# Patient Record
Sex: Female | Born: 1970 | Race: White | Hispanic: No | Marital: Married | Smoking: Former smoker
Health system: Southern US, Community
[De-identification: ages and names within clinical notes are randomized; demographics above are authoritative.]

## PROBLEM LIST (undated history)

## (undated) DIAGNOSIS — Z8742 Personal history of other diseases of the female genital tract: Secondary | ICD-10-CM

## (undated) DIAGNOSIS — D649 Anemia, unspecified: Secondary | ICD-10-CM

## (undated) DIAGNOSIS — B009 Herpesviral infection, unspecified: Secondary | ICD-10-CM

## (undated) DIAGNOSIS — F3281 Premenstrual dysphoric disorder: Secondary | ICD-10-CM

## (undated) DIAGNOSIS — B977 Papillomavirus as the cause of diseases classified elsewhere: Secondary | ICD-10-CM

## (undated) DIAGNOSIS — J45909 Unspecified asthma, uncomplicated: Secondary | ICD-10-CM

## (undated) HISTORY — DX: Personal history of other diseases of the female genital tract: Z87.42

## (undated) HISTORY — DX: Herpesviral infection, unspecified: B00.9

## (undated) HISTORY — PX: CYST REMOVAL HAND: SHX6279

## (undated) HISTORY — DX: Premenstrual dysphoric disorder: F32.81

## (undated) HISTORY — DX: Anemia, unspecified: D64.9

## (undated) HISTORY — PX: DILATATION & CURRETTAGE/HYSTEROSCOPY WITH RESECTOCOPE: SHX5572

## (undated) HISTORY — PX: WISDOM TOOTH EXTRACTION: SHX21

## (undated) HISTORY — DX: Papillomavirus as the cause of diseases classified elsewhere: B97.7

## (undated) HISTORY — DX: Unspecified asthma, uncomplicated: J45.909

---

## 1998-03-21 ENCOUNTER — Inpatient Hospital Stay (HOSPITAL_COMMUNITY): Admission: AD | Admit: 1998-03-21 | Discharge: 1998-03-21 | Payer: Self-pay | Admitting: *Deleted

## 1998-09-26 ENCOUNTER — Emergency Department (HOSPITAL_COMMUNITY): Admission: EM | Admit: 1998-09-26 | Discharge: 1998-09-26 | Payer: Self-pay | Admitting: Emergency Medicine

## 1999-09-30 ENCOUNTER — Inpatient Hospital Stay (HOSPITAL_COMMUNITY): Admission: AD | Admit: 1999-09-30 | Discharge: 1999-09-30 | Payer: Self-pay | Admitting: Obstetrics and Gynecology

## 1999-10-06 ENCOUNTER — Ambulatory Visit (HOSPITAL_COMMUNITY): Admission: RE | Admit: 1999-10-06 | Discharge: 1999-10-06 | Payer: Self-pay | Admitting: Obstetrics & Gynecology

## 1999-10-06 ENCOUNTER — Encounter: Payer: Self-pay | Admitting: Obstetrics & Gynecology

## 1999-11-01 ENCOUNTER — Inpatient Hospital Stay (HOSPITAL_COMMUNITY): Admission: AD | Admit: 1999-11-01 | Discharge: 1999-11-06 | Payer: Self-pay | Admitting: Obstetrics and Gynecology

## 1999-11-03 ENCOUNTER — Encounter (INDEPENDENT_AMBULATORY_CARE_PROVIDER_SITE_OTHER): Payer: Self-pay

## 1999-11-08 ENCOUNTER — Encounter: Admission: RE | Admit: 1999-11-08 | Discharge: 2000-02-06 | Payer: Self-pay | Admitting: Obstetrics and Gynecology

## 2000-02-08 ENCOUNTER — Encounter: Admission: RE | Admit: 2000-02-08 | Discharge: 2000-05-08 | Payer: Self-pay | Admitting: Obstetrics and Gynecology

## 2000-05-10 ENCOUNTER — Encounter: Admission: RE | Admit: 2000-05-10 | Discharge: 2000-07-17 | Payer: Self-pay | Admitting: Obstetrics and Gynecology

## 2000-10-29 ENCOUNTER — Other Ambulatory Visit: Admission: RE | Admit: 2000-10-29 | Discharge: 2000-10-29 | Payer: Self-pay | Admitting: Obstetrics and Gynecology

## 2001-05-13 ENCOUNTER — Inpatient Hospital Stay (HOSPITAL_COMMUNITY): Admission: AD | Admit: 2001-05-13 | Discharge: 2001-05-16 | Payer: Self-pay | Admitting: Obstetrics and Gynecology

## 2001-06-25 ENCOUNTER — Other Ambulatory Visit: Admission: RE | Admit: 2001-06-25 | Discharge: 2001-06-25 | Payer: Self-pay | Admitting: Obstetrics and Gynecology

## 2002-07-14 ENCOUNTER — Other Ambulatory Visit: Admission: RE | Admit: 2002-07-14 | Discharge: 2002-07-14 | Payer: Self-pay | Admitting: Obstetrics and Gynecology

## 2007-03-21 DIAGNOSIS — F3281 Premenstrual dysphoric disorder: Secondary | ICD-10-CM

## 2007-03-21 HISTORY — DX: Premenstrual dysphoric disorder: F32.81

## 2008-03-20 HISTORY — PX: OTHER SURGICAL HISTORY: SHX169

## 2008-10-07 ENCOUNTER — Encounter (INDEPENDENT_AMBULATORY_CARE_PROVIDER_SITE_OTHER): Payer: Self-pay | Admitting: Obstetrics and Gynecology

## 2008-10-07 ENCOUNTER — Ambulatory Visit (HOSPITAL_COMMUNITY): Admission: RE | Admit: 2008-10-07 | Discharge: 2008-10-07 | Payer: Self-pay | Admitting: Obstetrics and Gynecology

## 2010-01-06 ENCOUNTER — Encounter: Admission: RE | Admit: 2010-01-06 | Discharge: 2010-01-06 | Payer: Self-pay | Admitting: Obstetrics and Gynecology

## 2010-06-21 ENCOUNTER — Other Ambulatory Visit: Payer: Self-pay | Admitting: Dermatology

## 2010-06-26 LAB — PREGNANCY, URINE: Preg Test, Ur: NEGATIVE

## 2010-06-26 LAB — CBC
HCT: 28.8 % — ABNORMAL LOW (ref 36.0–46.0)
Hemoglobin: 9.3 g/dL — ABNORMAL LOW (ref 12.0–15.0)
MCHC: 32.3 g/dL (ref 30.0–36.0)
MCV: 66.3 fL — ABNORMAL LOW (ref 78.0–100.0)
Platelets: 438 10*3/uL — ABNORMAL HIGH (ref 150–400)
RBC: 4.35 MIL/uL (ref 3.87–5.11)
RDW: 19.4 % — ABNORMAL HIGH (ref 11.5–15.5)
WBC: 7.9 10*3/uL (ref 4.0–10.5)

## 2010-08-02 NOTE — Op Note (Signed)
NAME:  Barbara Nguyen, Barbara Nguyen                ACCOUNT NO.:  [id number]   MEDICAL RECORD NO.:  [id number]          PATIENT TYPE:  AMB   LOCATION:  SDC                           FACILITY:  [facility]   PHYSICIAN:  Dois Davenport A. Rivard, M.D. DATE OF BIRTH:  05-Aug-1970   DATE OF PROCEDURE:  10/07/2008  DATE OF DISCHARGE:  10/07/2008                               OPERATIVE REPORT   PREOPERATIVE DIAGNOSES:  Menorrhagia, endometrial polyps, and anemia.   POSTOPERATIVE DIAGNOSES:  Menorrhagia, endometrial polyps, and anemia.   PROCEDURES:  Hysteroscopy, resection of endometrial polyps, dilation and  curettage.   SURGEON:  Crist Fat. Rivard, MD   ANESTHESIA:  General.   ESTIMATED BLOOD LOSS:  Minimal.   PROCEDURE IN DETAIL:  After being informed of the planned procedure with  possible complications including bleeding, infection, injury to uterus,  informed consent was obtained.  The patient was taken to OR #3, given  general anesthesia with laryngeal mask ventilation without any  complication.  She was placed in lithotomy position, prepped and draped  in the sterile fashion and her bladder was emptied with an in-and-out  red rubber catheter.  Pelvic exam reveals an anteverted uterus, normal  in size and shape, 2 normal adnexa.  A weighted speculum was inserted in  the vagina and the anterior lip of the cervix was grasped with a  tenaculum forceps.  The uterus was sounded at 10 cm and the cervix was  easily dilated using Hegar dilator until #31 without difficulty.  This  allows for easy entry of the operative hysteroscope and with perfusion  of glycine 1.5% at a maximum pressure of 80 mmHg, we were able to  visualize the entire uterine cavity and both tubal ostia.  Before  inserting the hysteroscope, a paracervical block was performed using  Nesacaine 1% 20 mL in the usual fashion.   OBSERVATION:  Both tubal ostia were normal.  The fundal area of the  uterus is normal.  The anterior wall of the uterus is  normal.  But on  the posterior wall, we definitely see a thickening of the endometrium  with a pattern compatible with multiple polyps.  Using the resectoscope,  we resect that area of the posterior wall in 5 passes and cement the  specimens separately as endometrial polyps.  Hysteroscope was then  removed and a sharp curette is used to do a curettage of the endometrial  cavity removing a large amount of normal-appearing endometrium.  After  the curettage is completed, the hysteroscope is reinserted to visualize  a normal uterine cavity.   Instruments were removed.  A laceration on the anterior lip of the  cervix was repaired with a figure-of-eight stitch of 2-0 Vicryl.  Instruments and sponge count was complete x2.  Estimated blood loss was  minimal and water deficit  was 125 mL.  The procedure was very well tolerated by the patient who  was taken to recovery room and discharged home in a well and stable  condition.   SPECIMEN:  Endometrial polyps and endometrial curettings sent to  Pathology.  Crist Fat Rivard, M.D.  Electronically Signed     SAR/MEDQ  D:  10/07/2008  T:  10/08/2008  Job:  [id number]

## 2010-08-05 NOTE — Op Note (Signed)
[facility]  PatientJORJA, Barbara Nguyen Visit Number: [id number] MRN: [id number]          Service Type: OBS Location: 910A 9140 01 Attending Physician:  Ermalene Searing Dictated by:   Lenoard Aden, M.D. Proc. Date: 05/13/01 Admit Date:  05/13/2001   CC:         [facility]   Operative Report  PREOPERATIVE DIAGNOSIS:       Previous cesarean section, attempted vaginal birth after cesarean section, nonreassuring fetal heart rate tracing.  POSTOPERATIVE DIAGNOSIS:      Previous cesarean section, attempted vaginal birth after cesarean section, nonreassuring fetal heart rate tracing.  OPERATION:                    Repeat low segment transverse cesarean section.  SURGEON:                      Lenoard Aden, M.D.  ANESTHESIA:                   Epidural by Raul Del, M.D.  ESTIMATED BLOOD LOSS:         1200 cc.  COMPLICATIONS:                None.  COUNTS:                       Correct.  FINDINGS:                     Full-term living female, left occiput transverse position. Apgars 8 and 9. Anterior placenta delivered manually intact. Three-vessel cord noted.  Normal tubes and ovaries.  Right lateral extension to the right uterine artery.  No cervical extensions.  BRIEF OPERATIVE NOTE:         After being apprised of the risks of anesthesia, infection, bleeding, intra-abdominal injuries, and need for repair, the patient was brought to the operating room where she was administered epidural anesthetic without complications, prepped and draped in the usual sterile fashion.  Foley catheter previously placed.  After achieving adequate anesthesia, incision was made through the old Pfannenstiel stress incontinence, carried down with a scalpel to the fascia which was nicked in the midline and opened transversely using Mayo scissors.  Rectus muscle dissected sharply in the midline.  Peritoneum was entered sharply.  Bladder blade was  placed.  Visceroperitoneum scored in the smiley fashion.  Uterus scored in the smiley fashion for delivery of full term living female from the left occiput transverse position and handed to the pediatricians in attendance.  Apgars 8 and 9.  Placenta delivered manually intact from an anterior position a three-vessel cord noted.  The cord pH collected was 7.31. At this time the uterus was exteriorized.  Right extension into the right uterine artery was noted and clamped using sponge forceps.  The incision was then closed using a 0 Monocryl in continuous running fashion secured at the right uterine vessel.  Good hemostasis was noted.  A second imbricating layer, nonlocking, running stitch placed in the midline in the area of bleeding. Good hemostasis is noted.  The bladder flap was inspected.  Pericolic gutters irrigated.  All blood clot subsequently removed.  Uterus replaced after normal tubes and ovaries noted and reinspection of incision reveals good hemostasis. Fascial incision then closed using a 0 Vicryl in the subcutaneous fascia and skin closed using staples.  The patient  tolerated the procedure well, was transferred to recovery in good condition.  Please note that prior to skin incision, 15 cc of a dilute 0.5% Marcaine solution was placed. Dictated by:   Lenoard Aden, M.D. Attending Physician:  Marina Gravel B DD:  05/13/01 TD:  05/13/01 Job: [id number]

## 2010-08-05 NOTE — Discharge Summary (Signed)
[facility]  PatientCLOTIEL, Barbara Nguyen Visit Number: [id number] MRN: [id number]          Service Type: OBS Location: 910A 9140 01 Attending Physician:  Ermalene Searing Dictated by:   Lenoard Aden, M.D. Admit Date:  05/13/2001 Discharge Date: 05/16/2001                             Discharge Summary  HOSPITAL COURSE:              The patient underwent uncomplicated repeat C-section May 13, 2001.  Postop course uncomplicated.  Hemoglobin and hematocrit within normal limits.  Discharged to home day 3.  Darvocet, Motrin, prenatal vitamins given.  Follow-up in the office in two days for staple removal. Dictated by:   Lenoard Aden, M.D. Attending Physician:  Marina Gravel B DD:  06/07/01 TD:  06/10/01 Job: [id number]

## 2010-08-05 NOTE — H&P (Signed)
[facility]  Patient:    Barbara Nguyen, Barbara Nguyen                       MRN: [id number] Adm. Date:  [id number] Attending:  Silverio Lay A                         History and Physical  REASON FOR ADMISSION:  Intrauterine pregnancy at 39 weeks with spontaneous rupture of membranes.  HISTORY OF PRESENT ILLNESS:  This is a 40 year old married white female gravida 2, para 0, abortion 1 with an estimated delivery date of November 09, 1999, being admitted at 39 weeks with spontaneous rupture of membranes at 10:30 p.m. on November 01, 1999.  The patient denies any bleeding, denies any contraction, and reports good fetal activity and also denies symptoms of pregnancy induced hypertension.  She was first evaluated at maternity admission at 23:49 on November 01, 1999, with positive fern vaginal exam at 1 cm 50% effaced, vertex -1.  The patient was kept at maternity admission, having no bed in labor and delivery for admission and was encouraged to ambulate which she did part of the night. She finely had a bed around 6:00 this morning which felt no contractions.  Prenatal course reveals blood type to be A+, RPR nonreactive, rubella immune, HBsAg negative, HIV nonreactive, Pap smear within normal limits, 16 week AFP within normal limits, 20 weeks ultrasound with a normal anatomy survey and posterior placenta.  Ultrasound was repeated at 24 weeks to visualize right outflow track which was still not seen.  Ultrasound was repeated at 28 weeks where ventricular outflow track was observed and normal and an estimated fetal weight at 50% percentile.  A 28 weeks glucose tolerance test was elevated but the 3 hour glucose tolerance test was normal.  A 35 week group B streptococcus was negative.  The prenatal course was otherwise uneventful.  PAST MEDICAL HISTORY:  No known drug allergies.  Complete continuous miscarriage in January 2000 at 8 weeks without complication.  Positive  HPV diagnosis in 1990.  Mild asthma with Rescue inhaler p.r.n.  FAMILY HISTORY:  Mother with chronic hypertension.  Mother with hypothyroidism, mother with depression and father with depression.  SOCIAL HISTORY:  Married, nonsmoker.  PHYSICAL EXAMINATION:  VITAL SIGNS:  Blood pressure slightly elevated 140/90 with no symptoms of pregnancy-induced hypertension.  HEENT:  Negative.  LUNGS:  Clear.  HEART:  Normal.  ABDOMEN:  Gravid and nontender.  Vertex presentation by Leopolds maneuver. VAGINA:  Vaginal exam upon arrival 1 cm 50% effaced, vertex -1, not repeated at this time.  EXTREMITIES:  2+ pitting edema, no clonus.  FETAL HEART RATE:  Tracings reviewed and reassuring.  ASSESSMENT:  Intrauterine pregnancy at 39 weeks with spontaneous rupture of membranes and no evidence of active labor.  Mildly elevated blood pressure without symptoms of pregnancy-induced hypertension.  Strong family history of depression.  PLAN:  With proceed with Pitocin augmentation since labor has not started on its own.  Will draw PIH labs.  Spontaneous vaginal delivery expected. DD:  11/02/99 TD:  11/02/99 Job: [id number]

## 2010-08-05 NOTE — Op Note (Signed)
[facility]  Patient:    Barbara Nguyen, Barbara Nguyen                       MRN: [id number] Proc. Date: 11/03/99 Adm. Date:  [id number] Attending:  Esmeralda Arthur CC:         [facility]   Operative Report  PREOPERATIVE DIAGNOSES:       1. A 39-week intrauterine pregnancy,                               2. Presumed large for gestational age.                               3. Active phase arrest.                               4. Chorioamnionitis.  POSTOPERATIVE DIAGNOSES:      1. A 39-week intrauterine pregnancy,                               2. Presumed large for gestational age.                               3. Active phase arrest.                               4. Chorioamnionitis.  OPERATION:                    Primary lower segment transverse cesarean                               section.  SURGEON:                      Lenoard Aden, M.D.  ASSISTANT:                    Sung Amabile. Roslyn Smiling, M.D.  ANESTHESIA:  ESTIMATED BLOOD LOSS:         1000 cc.  COMPLICATIONS:                None.  DRAINS:                       Foley.  FINDINGS:                     A full-term living female, occiput anterior position, Apgars 8 and 9. Pediatrician in attendance.  Normal tubes and ovaries noted.  DESCRIPTION OF PROCEDURE:     After being apprised of the risks of anesthesia, infection, injury to abdominal organs and need for repair, the patient was brought to the operating room where she was prepped and draped and ______________ without complications.  After establishing adequate anesthesia, a Pfannenstiel skin incision was made with a scalpel.  The fascia will be snipped in the midline and will be transversed using Mayo scissors. Rectus muscles dissected sharply in the midline. Peritoneal cavity entered sharply.  Bladder blade was placed.  Visceral peritoneum was scored in a  smilelike fashion using Metzenbaum scissors and dissected sharply off the lower  uterine segment.  Current hysterotomy incision used in making incision. Atraumatic delivery of a full-term living female fetus, Apgars 8 and 9, handed to the pediatricians who are in attendance.  Cord bloods are collected. Placenta delivered manually intact.  Uterus curetted using dry lap pack and exteriorized.  Normal tubes and ovaries noted.  The uterus was closed using a 0 Monocryl in continuous running fashion.  Good hemostasis achieved.  There was some bleeding from the right incision which is right at the level of the uterine artery.  An OLeary stitch is placed x 1 encompassing the uterine vessels above and below the incision through an avascular area of the broad ligament in sort of a mattress suture fashion to tamponade this area of bleeding which shows good hemostasis post OLeary stitch and subsequently no evidence of broad ligament hematoma.  At this time bladder flap inspected. Fascia closed using 0 Vicryl in continuous running fashion. Skin closed using staples. The patient tolerated the procedure well.  The patient was taken to the recovery room in good condition. DD:  11/03/99 TD:  11/04/99 Job: [id number]

## 2010-08-05 NOTE — Discharge Summary (Signed)
[facility]  Patient:    Barbara Nguyen, Barbara Nguyen                       MRN: [id number] Adm. Date:  [id number] Disc. Date: [id number] Attending:  Esmeralda Arthur                           Discharge Summary  HOSPITAL COURSE:              The patient underwent uncomplicated low segment transverse cesarean section on November 03, 1999, for active phase arrest.  Her postoperative course was uncomplicated.  Hemoglobin and hematocrit normal.  DISPOSITION:                  She was discharged to home on day #3.  DISCHARGE MEDICATIONS:        Prenatal vitamins, iron and Tylox given.  FOLLOW-UP:                    She will follow up in the office in four to six weeks.  Discharge teaching done and additional care discussed. DD:  12/14/99 TD:  12/15/99 Job: [id number]

## 2011-03-27 ENCOUNTER — Other Ambulatory Visit: Payer: Self-pay | Admitting: Obstetrics and Gynecology

## 2011-03-27 DIAGNOSIS — Z1231 Encounter for screening mammogram for malignant neoplasm of breast: Secondary | ICD-10-CM

## 2011-04-11 ENCOUNTER — Ambulatory Visit
Admission: RE | Admit: 2011-04-11 | Discharge: 2011-04-11 | Disposition: A | Discharge status: Home / Self Care | Payer: BC Managed Care – PPO | Source: Ambulatory Visit | Attending: Obstetrics and Gynecology | Admitting: Obstetrics and Gynecology

## 2011-04-11 DIAGNOSIS — Z1231 Encounter for screening mammogram for malignant neoplasm of breast: Secondary | ICD-10-CM

## 2012-01-24 ENCOUNTER — Ambulatory Visit: Payer: BC Managed Care – PPO | Admitting: Obstetrics and Gynecology

## 2012-02-05 ENCOUNTER — Other Ambulatory Visit: Payer: Self-pay | Admitting: Obstetrics and Gynecology

## 2012-02-05 DIAGNOSIS — IMO0001 Reserved for inherently not codable concepts without codable children: Secondary | ICD-10-CM

## 2012-02-06 ENCOUNTER — Other Ambulatory Visit: Payer: Self-pay | Admitting: Obstetrics and Gynecology

## 2012-02-06 MED ORDER — ETONOGESTREL-ETHINYL ESTRADIOL 0.12-0.015 MG/24HR VA RING: VAGINAL_RING | VAGINAL | Status: DC

## 2012-02-06 NOTE — Telephone Encounter (Signed)
Approved rx refill. nuvaring use as directed with 1 refill. Pt has aex With SR on 02/20/12

## 2012-02-20 ENCOUNTER — Encounter: Payer: Self-pay | Admitting: Obstetrics and Gynecology

## 2012-02-20 ENCOUNTER — Ambulatory Visit (INDEPENDENT_AMBULATORY_CARE_PROVIDER_SITE_OTHER): Payer: BC Managed Care – PPO | Admitting: Obstetrics and Gynecology

## 2012-02-20 VITALS — BP 110/68 | Ht 61.0 in | Wt 132.0 lb

## 2012-02-20 DIAGNOSIS — B009 Herpesviral infection, unspecified: Secondary | ICD-10-CM | POA: Insufficient documentation

## 2012-02-20 DIAGNOSIS — Z309 Encounter for contraceptive management, unspecified: Secondary | ICD-10-CM

## 2012-02-20 DIAGNOSIS — Z01419 Encounter for gynecological examination (general) (routine) without abnormal findings: Secondary | ICD-10-CM

## 2012-02-20 DIAGNOSIS — R6882 Decreased libido: Secondary | ICD-10-CM

## 2012-02-20 MED ORDER — NONFORMULARY OR COMPOUNDED ITEM: 1.0000 "application " | Status: AC | PRN

## 2012-02-20 NOTE — Progress Notes (Signed)
The patient reports:wants to talk about other options of BC   Contraception:Nuvaring  Last mammogram: 04/11/2011 Normal Last pap: 01/19/2011 Normal  GC/Chlamydia cultures offered: declined HIV/RPR/HbsAg offered:  declined HSV 1 and 2 glycoprotein offered: declined  Menstrual cycle regular and monthly: No:  Nuvaring  continuous Menstrual flow normal: amenorrhea  Urinary symptoms: none Normal bowel movements: Yes  Reports abuse at home: No:   Subjective:    Barbara Nguyen is a 41 y.o. female, [id number], who presents for an annual exam.     History   Social History  . Marital Status: Married    Spouse Name: N/A    Number of Children: N/A  . Years of Education: N/A   Social History Main Topics  . Smoking status: Former Games developer  . Smokeless tobacco: Never Used  . Alcohol Use: Yes     Comment: occasional  . Drug Use: No  . Sexually Active: Yes    Birth Control/ Protection: IUD     Comment: Nuvaring   Other Topics Concern  . None   Social History Narrative  . None    Menstrual cycle:   LMP: Patient's last menstrual period was 01/21/2011.           Cycle: Normal  The following portions of the patient's history were reviewed and updated as appropriate: allergies, current medications, past family history, past medical history, past social history, past surgical history and problem list.  Review of Systems Pertinent items are noted in HPI. Breast:Negative for breast lump,nipple discharge or nipple retraction Gastrointestinal: Negative for abdominal pain, change in bowel habits or rectal bleeding Urinary:negative   Objective:    BP 110/68  Ht 5\' 1"  (1.549 m)  Wt 132 lb (59.875 kg)  BMI 24.94 kg/m2  LMP 01/21/2011    Weight:  Wt Readings from Last 1 Encounters:  02/20/12 132 lb (59.875 kg)          BMI: Body mass index is 24.94 kg/(m^2).  General Appearance: Alert, appropriate appearance for age. No acute distress HEENT: Grossly normal Neck / Thyroid: Supple, no  masses, nodes or enlargement Lungs: clear to auscultation bilaterally Back: No CVA tenderness Breast Exam: Normal to inspection and No masses or nodes.No dimpling, nipple retraction or discharge. Cardiovascular: Regular rate and rhythm. S1, S2, no murmur Gastrointestinal: Soft, non-tender, no masses or organomegaly Pelvic Exam: Vulva and vagina appear normal. Bimanual exam reveals normal uterus and adnexa. AV Rectovaginal: normal rectal, no masses Lymphatic Exam: Non-palpable nodes in neck, clavicular, axillary, or inguinal regions Skin: no rash or abnormalities Neurologic: Normal gait and speech, no tremor  Psychiatric: Alert and oriented, appropriate affect.     Assessment:    Normal gyn exam Contraceptive management    Plan:    mammogram pap smear return annually or prn Contraception:NuvaRing vaginal inserts: would like to change to IUD Decreased Libido discussed Anteverted uterus  Combined oral contraceptives was reviewed with the patient     With expected benefits of: cycle control, reduction in menstrual flow and dysmenorrhea, improvement of PMS and reduction of ovarian cysts and ovarian cancer. Risks of DVT/PE discussed.  Tobacco use: none, withouthistory of DVT/PE. Pertinent medical history:none  Mirena was reviewed with the patient With expected benefits of: lack of estrogen,5 year duration, high reliability at 99.9%, reversibility, reduction or cessation of menstrual flow and improvement or resolution of dysmenorrhea/pelvic pain. Risks at the time of insertion were reviewed: dysfunctional uterine bleeding lasting up to 6 months, infection and uterine perforation which may require  laparoscopy for retrieval.  Instructions for day of insertion discussed:  yes avoidance of unprotected intercourse 2 weeks prior, best to schedule during a menstrual cycle and use of Ibuprofen 600 mg 1 hour before appointment.   GC/Chlamydia was collected at this visit: yes  Will schedule  Mirena insertion Yell gell refilled and pt encouraged to try  Silverio Lay MD

## 2012-02-21 LAB — PAP IG, CT-NG, RFX HPV ASCU
Chlamydia Probe Amp: NEGATIVE
GC Probe Amp: NEGATIVE

## 2012-03-25 ENCOUNTER — Encounter: Payer: Self-pay | Admitting: Obstetrics and Gynecology

## 2012-03-25 ENCOUNTER — Ambulatory Visit (INDEPENDENT_AMBULATORY_CARE_PROVIDER_SITE_OTHER): Payer: BC Managed Care – PPO | Admitting: Obstetrics and Gynecology

## 2012-03-25 VITALS — BP 100/64 | HR 68 | Wt 139.0 lb

## 2012-03-25 DIAGNOSIS — R6882 Decreased libido: Secondary | ICD-10-CM

## 2012-03-25 NOTE — Progress Notes (Addendum)
42 YO presents with complaints of decreased libido for many years.  Patient is on Celexa and Nuvaring,  has only had one orgasm.  Was sick before Christmas with fever with coughing ( with yellow/green sputum),  burning and pain in chest.  Now chest does not hurt but still coughs without any production and does not have the deep pain with coughing.  Admits to shortness of breath and chest tightness (has mild asthma but does not like the effects of inhalers so will not use them) .  Has used some Dimmetapp & Mucinex.  O: Lungs: clear with increased expiratory phase  A: Decreased Libido      S/P Bronchitis with residual cough  P: Reviewed causes of decreased libido in women-patient states that all apply      To outline plan for working on each area       Business cards given on [employer] for further assistance with her & husband       Names of books given to assist with change in mind set as it relates to romance      Encouraged the read of 5 Love Languages (states that her husband has been wanting to      do that as a couple)       Testosterone-pending;  reviewed side effects of excess testosterone       RTO-as scheduled or prn  Barbara Aguayo, PA-C

## 2012-03-26 ENCOUNTER — Telehealth: Payer: Self-pay | Admitting: Obstetrics and Gynecology

## 2012-03-26 LAB — TESTOSTERONE, FREE, TOTAL, SHBG: Sex Hormone Binding: 194 nmol/L — ABNORMAL HIGH (ref 18–114)

## 2012-03-26 MED ORDER — AMBULATORY NON FORMULARY MEDICATION: 1.0000 mL | Freq: Every day | Status: AC

## 2012-03-26 NOTE — Telephone Encounter (Signed)
Call to patient to inform of undetectable testosterone levels.  Patient would like to try a trial of testosterone cream. Reviewed with patient the risks of excess testosterone to include, but not limited to: increased lipids, unwanted hair growth, female pattern baldness, deepening of voice, clitoromegaly, acne, oily skin and aggression.  To send prescription to [facility]: Testosterone Cream 0.1 mg/1 mL  2 month supply 1 refill to apply in perineal area or between thighs in a. m. daily.  Veroncia Jezek, PA-C

## 2012-04-17 ENCOUNTER — Telehealth: Payer: Self-pay | Admitting: Obstetrics and Gynecology

## 2012-04-17 NOTE — Telephone Encounter (Signed)
Call to patient to follow up on her "homework" given at last visit.  States she hasn't done any of it and really has no excuse.  Goes on to say that she's been trying to schedule an IUD insertion but had not called  Barbara Nguyen) the scheduler back.  Transferred patient to Barbara Nguyen extension.  Barbara Soderholm, PA-C

## 2012-04-24 ENCOUNTER — Ambulatory Visit: Payer: BC Managed Care – PPO | Admitting: Obstetrics and Gynecology

## 2012-04-24 ENCOUNTER — Encounter: Payer: Self-pay | Admitting: Obstetrics and Gynecology

## 2012-04-24 VITALS — BP 114/70 | HR 72 | Wt 138.5 lb

## 2012-04-24 DIAGNOSIS — IMO0001 Reserved for inherently not codable concepts without codable children: Secondary | ICD-10-CM

## 2012-04-24 DIAGNOSIS — Z30430 Encounter for insertion of intrauterine contraceptive device: Secondary | ICD-10-CM

## 2012-04-24 LAB — POCT URINE PREGNANCY: Preg Test, Ur: NEGATIVE

## 2012-04-24 MED ORDER — LEVONORGESTREL 20 MCG/24HR IU IUD
INTRAUTERINE_SYSTEM | Freq: Once | INTRAUTERINE | Status: AC
Start: 2012-04-24 — End: 2012-04-24
  Administered 2012-04-24: 1 via INTRAUTERINE

## 2012-04-24 NOTE — Progress Notes (Signed)
IUD INSERTION NOTE  Barbara Nguyen is a 42 y.o. female [id number] who presents for IUD insertion.  Consent signed after risks and benefits were reviewed including but not limited to bleeding, infection, expulsion and risk of uterine perforation that may require an additional procedure for removal.  LMP: Patient's last menstrual period was 04/10/2012. UPT: negative   MIRENA LOT NUMBER: [id number]  Uterus assessed for size and position Prepped with Betadine Tenaculum placed on anterior lip of cervix after Hurricane gel was applied Uterus sounded at  7. 5 cm Insertion of MIRENA IUD per protocol without any complications Strings trimmed   Assessment:  IUD Insertion  Plan:  1. Patient instructed to call with oral temperature of 100.4 degrees Fahrenheit or more, excessive bleeding or pain that is not relieved with OTC analgesia taken as directed  2. Patient instructed on how  to check IUD strings and encouraged to do so after each menstrual cycle  3. Advised not to place anything in vagina or have sexual intercourse for 7 days  4. Follow-up: 4 weeks   Shyann Hefner PA-C 04/24/2012 11:43 AM

## 2012-04-24 NOTE — Patient Instructions (Signed)
Call [facility] [phone]:  -for temperature of 100.4 degrees Fahrenheit or more -pain not improved with over the counter pain medications (Ibuprofen, Advil, Aleve,        Tylenol or acetaminophen) -for excessive bleeding (more than a usual period) -for any other concerns  Do not place anything in your vagina for the next 7 days    

## 2012-05-22 ENCOUNTER — Ambulatory Visit: Payer: BC Managed Care – PPO | Admitting: Obstetrics and Gynecology

## 2012-05-22 ENCOUNTER — Encounter: Payer: Self-pay | Admitting: Obstetrics and Gynecology

## 2012-05-22 VITALS — BP 100/62 | HR 70 | Wt 141.0 lb

## 2012-05-22 DIAGNOSIS — Z30431 Encounter for routine checking of intrauterine contraceptive device: Secondary | ICD-10-CM

## 2012-05-22 LAB — POCT URINE PREGNANCY: Preg Test, Ur: NEGATIVE

## 2012-05-22 NOTE — Progress Notes (Signed)
42 YO with Mirena placement last month for follow up-has no complaints.   Has begun reading the 5 Love Languages book and will have a dialog with her husband once he's read it also as they work on marriage enrichment.  Seems excited about the prospect of this current "journey".  O: Abdomen: soft, non-tender without masses       Pelvic: EGBUS-wnl, vagina-normal, cervix- string visible, no lesions and without tenderness, uterus-normal size and adnexae-no tenderness or masses  UPT-negative  A: IUD Check Up  P:  RTO-as scheduled or prn  POWELL,ELMIRA, PA-C

## 2013-02-27 ENCOUNTER — Other Ambulatory Visit: Payer: Self-pay | Admitting: Internal Medicine

## 2013-02-27 DIAGNOSIS — R1900 Intra-abdominal and pelvic swelling, mass and lump, unspecified site: Secondary | ICD-10-CM

## 2013-02-28 ENCOUNTER — Ambulatory Visit
Admission: RE | Admit: 2013-02-28 | Discharge: 2013-02-28 | Disposition: A | Discharge status: Home / Self Care | Payer: BC Managed Care – PPO | Source: Ambulatory Visit | Attending: Internal Medicine | Admitting: Internal Medicine

## 2013-02-28 DIAGNOSIS — R1900 Intra-abdominal and pelvic swelling, mass and lump, unspecified site: Secondary | ICD-10-CM

## 2013-03-19 ENCOUNTER — Other Ambulatory Visit: Payer: Self-pay | Admitting: Internal Medicine

## 2013-03-19 DIAGNOSIS — R1902 Left upper quadrant abdominal swelling, mass and lump: Secondary | ICD-10-CM

## 2013-08-04 ENCOUNTER — Ambulatory Visit
Admission: RE | Admit: 2013-08-04 | Discharge: 2013-08-04 | Disposition: A | Discharge status: Home / Self Care | Payer: BC Managed Care – PPO | Source: Ambulatory Visit | Attending: Allergy and Immunology | Admitting: Allergy and Immunology

## 2013-08-04 ENCOUNTER — Other Ambulatory Visit: Payer: Self-pay | Admitting: Allergy and Immunology

## 2013-08-04 DIAGNOSIS — R059 Cough, unspecified: Secondary | ICD-10-CM

## 2013-08-04 DIAGNOSIS — R05 Cough: Secondary | ICD-10-CM

## 2015-08-27 IMAGING — US US ABDOMEN COMPLETE
1 series · 14 of 25 positions shown · non-contrast
Comparison: None.

CLINICAL DATA: Palpable abdominal mass, question lipoma.

EXAM:
ULTRASOUND ABDOMEN COMPLETE

[Series 1: us abdomen complete · 0.24mm/px · 78 acquisitions, 14 frames shown]
[im 1/78]
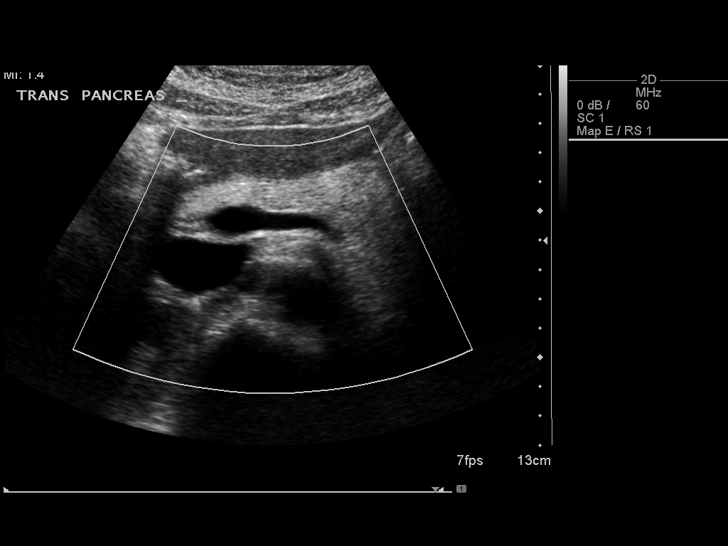
[im 7/78]
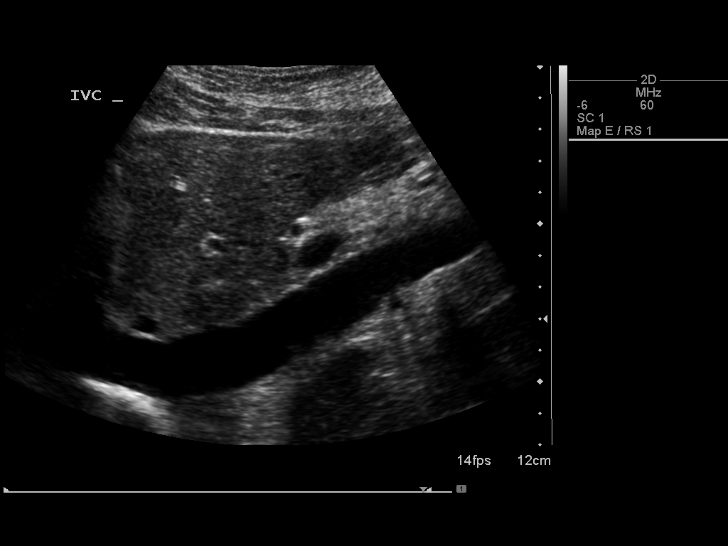
[im 13/78]
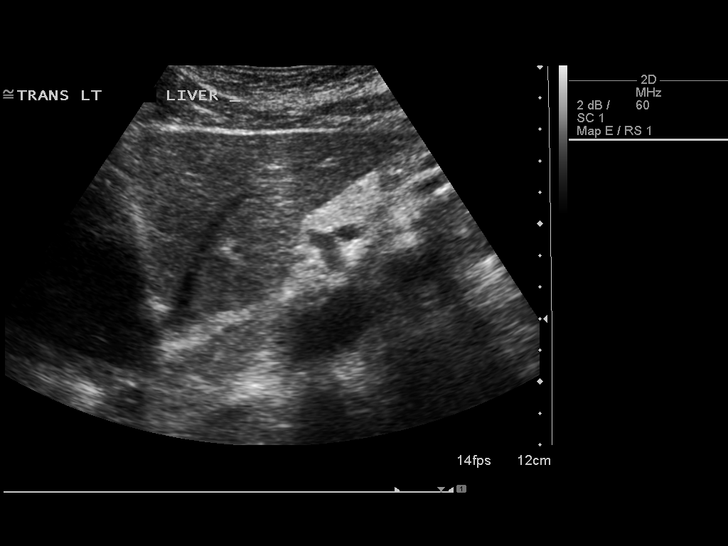
[im 20/78]
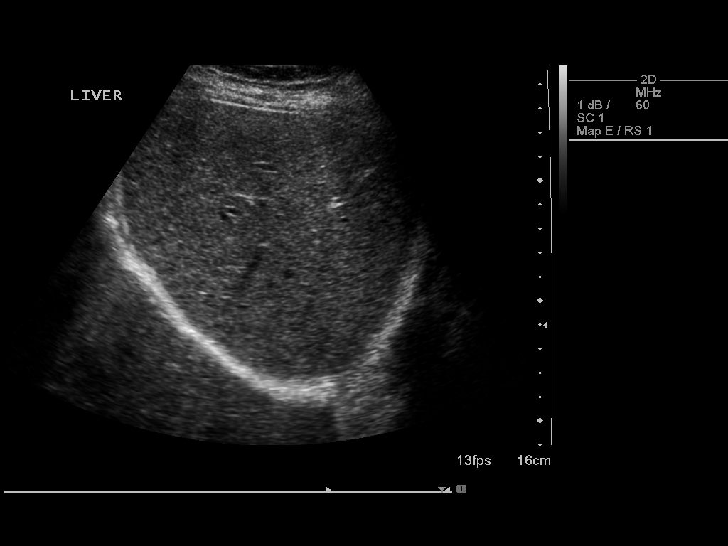
[im 26/78]
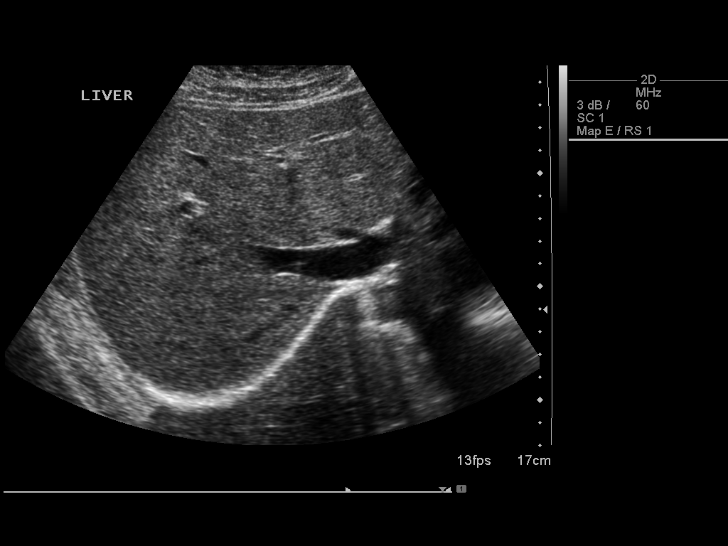
[im 29/78]
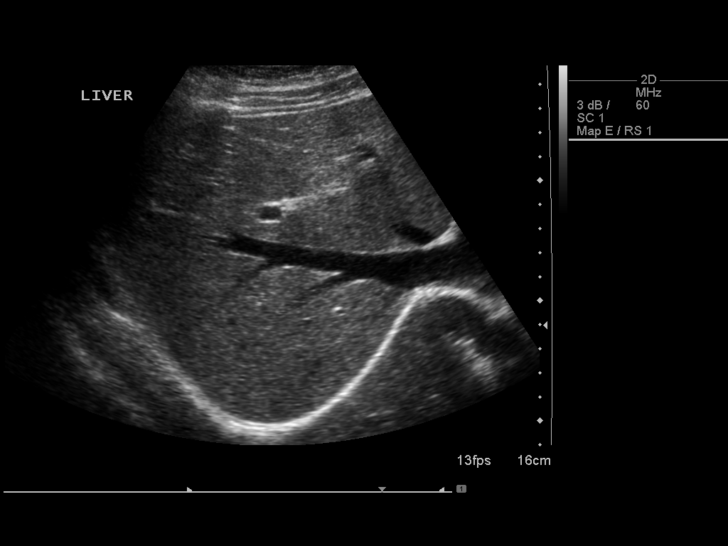
[im 36/78]
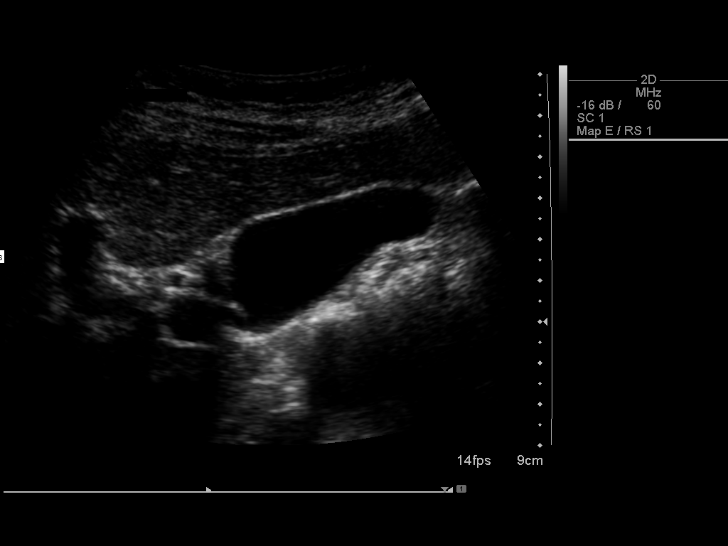
[im 42/78]
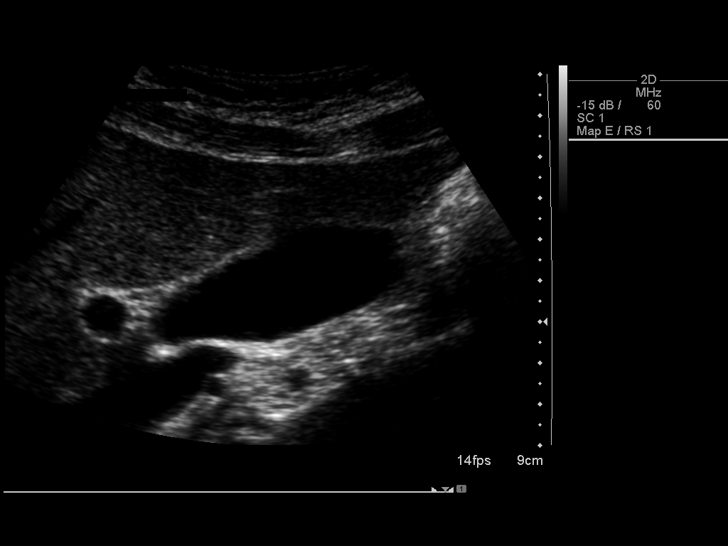
[im 49/78]
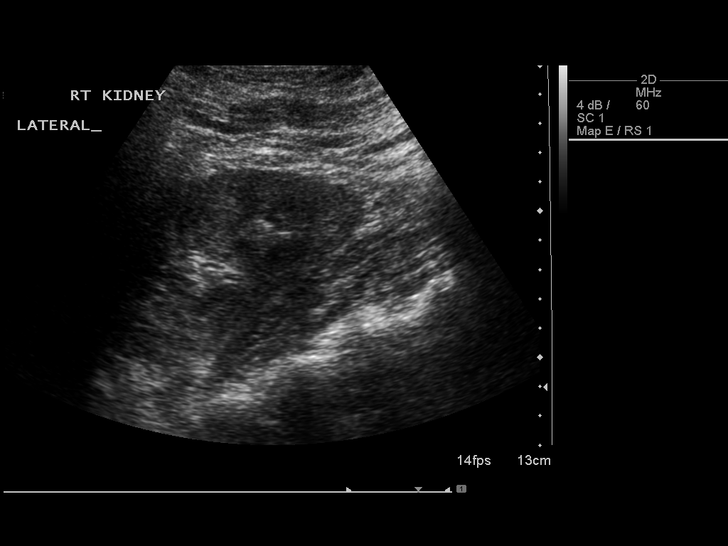
[im 52/78]
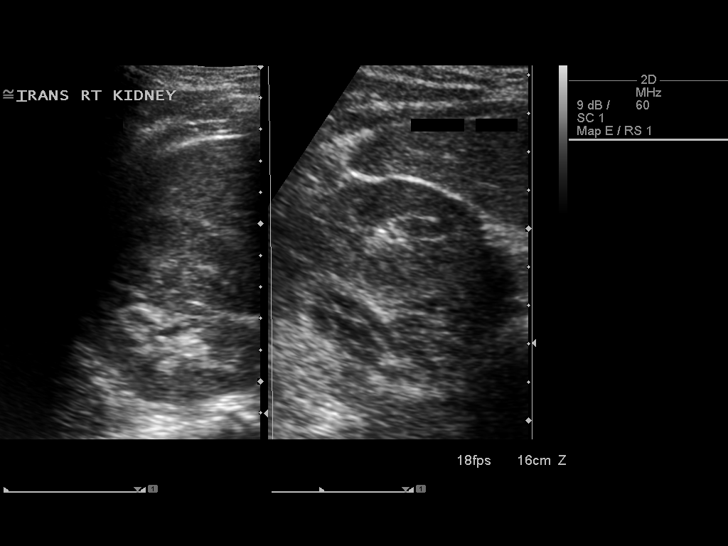
[im 58/78]
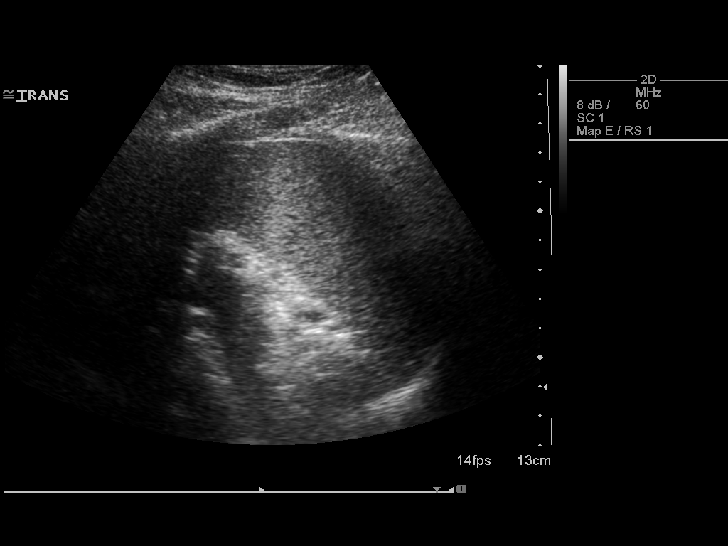
[im 65/78]
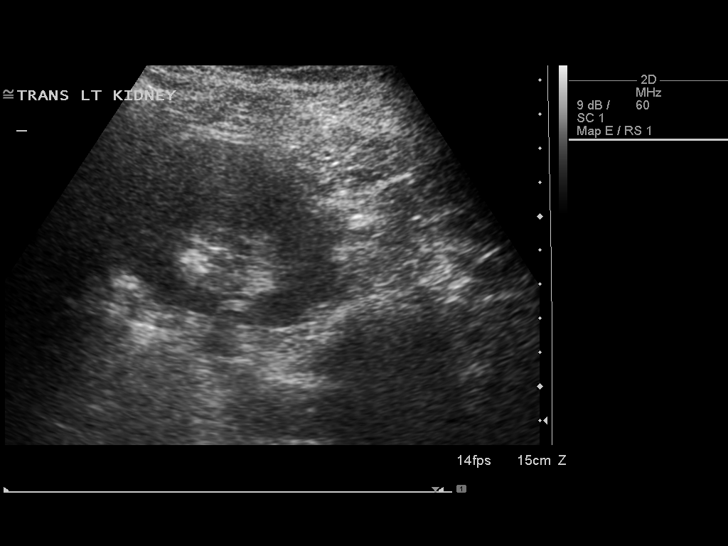
[im 71/78]
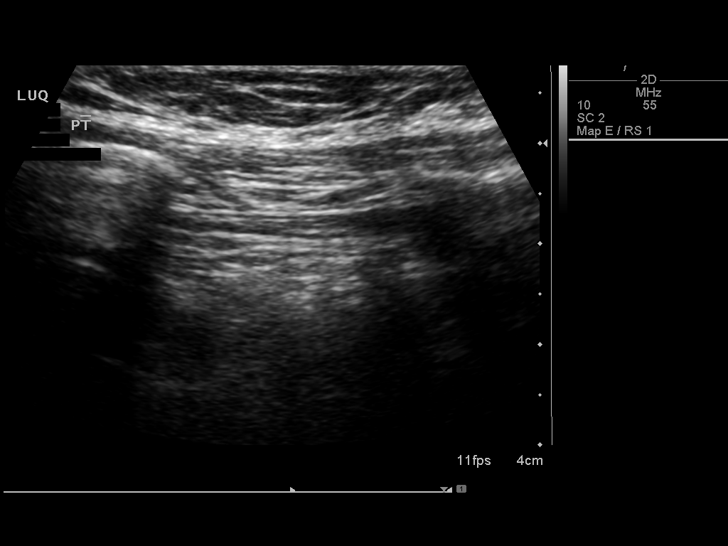
[im 78/78]
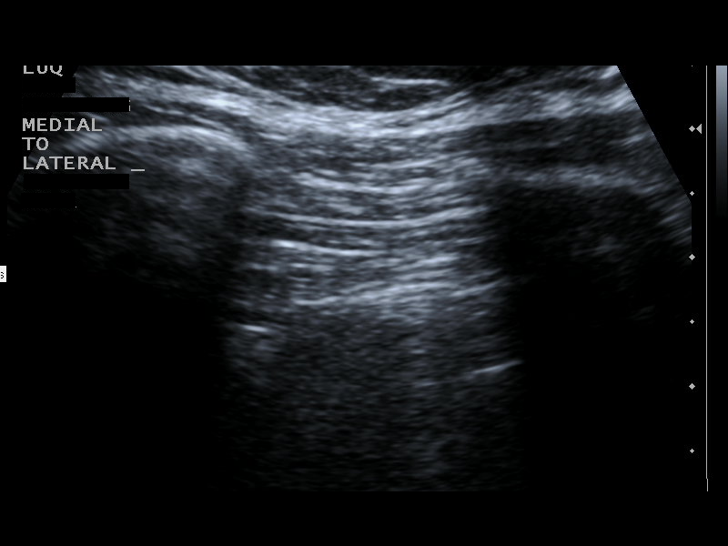

[14 of 25 positions shown; findings below may reference images not displayed]

FINDINGS: Gallbladder:

No gallstones or wall thickening visualized. No sonographic Murphy
sign noted.

Common bile duct:

Diameter: 3.2 mm

Liver:

No focal lesion identified. Within normal limits in parenchymal
echogenicity.

IVC:

No abnormality visualized.

Pancreas:

Visualized portion unremarkable.

Spleen:

Size and appearance within normal limits.

Right Kidney:

Length: 10.9 cm. Echogenicity within normal limits. No mass or
hydronephrosis visualized.

Left Kidney:

Length: 12.5 cm. Echogenicity within normal limits. No mass or
hydronephrosis visualized.

Abdominal aorta:

No aneurysm visualized.

Other findings:

At the site of palpable abnormality within the left upper quadrant
there is a 2.8 x 0.6 x 1.9 cm predominately isoechoic area with a
few thin internal echogenic striations.
IMPRESSION: Predominately isoechoic mass corresponds with the patient's
subcutaneous palpable abnormality. Ultrasound findings are
nonspecific however suggestive of a possible lipoma. Recommend
characterization with CT or MRI.

## 2017-02-07 DIAGNOSIS — Z1231 Encounter for screening mammogram for malignant neoplasm of breast: Secondary | ICD-10-CM | POA: Diagnosis not present

## 2017-02-07 DIAGNOSIS — Z6829 Body mass index (BMI) 29.0-29.9, adult: Secondary | ICD-10-CM | POA: Diagnosis not present

## 2017-02-07 DIAGNOSIS — Z3040 Encounter for surveillance of contraceptives, unspecified: Secondary | ICD-10-CM | POA: Diagnosis not present

## 2017-02-07 DIAGNOSIS — Z01419 Encounter for gynecological examination (general) (routine) without abnormal findings: Secondary | ICD-10-CM | POA: Diagnosis not present

## 2017-04-09 DIAGNOSIS — N390 Urinary tract infection, site not specified: Secondary | ICD-10-CM | POA: Diagnosis not present

## 2017-04-09 DIAGNOSIS — Z0000 Encounter for general adult medical examination without abnormal findings: Secondary | ICD-10-CM | POA: Diagnosis not present

## 2017-04-16 DIAGNOSIS — H04129 Dry eye syndrome of unspecified lacrimal gland: Secondary | ICD-10-CM | POA: Diagnosis not present

## 2017-04-16 DIAGNOSIS — M79674 Pain in right toe(s): Secondary | ICD-10-CM | POA: Diagnosis not present

## 2017-04-16 DIAGNOSIS — Z0000 Encounter for general adult medical examination without abnormal findings: Secondary | ICD-10-CM | POA: Diagnosis not present

## 2017-04-20 DIAGNOSIS — D225 Melanocytic nevi of trunk: Secondary | ICD-10-CM | POA: Diagnosis not present

## 2017-04-20 DIAGNOSIS — D2261 Melanocytic nevi of right upper limb, including shoulder: Secondary | ICD-10-CM | POA: Diagnosis not present

## 2017-04-20 DIAGNOSIS — L821 Other seborrheic keratosis: Secondary | ICD-10-CM | POA: Diagnosis not present

## 2017-04-20 DIAGNOSIS — D2262 Melanocytic nevi of left upper limb, including shoulder: Secondary | ICD-10-CM | POA: Diagnosis not present

## 2017-04-30 ENCOUNTER — Encounter: Payer: Self-pay | Admitting: Podiatry

## 2017-04-30 ENCOUNTER — Ambulatory Visit (INDEPENDENT_AMBULATORY_CARE_PROVIDER_SITE_OTHER): Payer: BLUE CROSS/BLUE SHIELD

## 2017-04-30 ENCOUNTER — Other Ambulatory Visit: Payer: Self-pay | Admitting: Podiatry

## 2017-04-30 ENCOUNTER — Ambulatory Visit: Payer: BLUE CROSS/BLUE SHIELD | Admitting: Podiatry

## 2017-04-30 DIAGNOSIS — M205X9 Other deformities of toe(s) (acquired), unspecified foot: Secondary | ICD-10-CM | POA: Diagnosis not present

## 2017-04-30 DIAGNOSIS — M722 Plantar fascial fibromatosis: Secondary | ICD-10-CM

## 2017-04-30 DIAGNOSIS — M79672 Pain in left foot: Secondary | ICD-10-CM | POA: Diagnosis not present

## 2017-04-30 DIAGNOSIS — M79671 Pain in right foot: Secondary | ICD-10-CM

## 2017-04-30 DIAGNOSIS — M205X1 Other deformities of toe(s) (acquired), right foot: Secondary | ICD-10-CM

## 2017-04-30 NOTE — Progress Notes (Signed)
Subjective:   Patient ID: Barbara Nguyen, female   DOB: 47 y.o.   MRN: [id number]   HPI Patient presents with painful big toe joint right of approximate 47-month duration.  States is been getting gradually worse over that time making it harder for her to walk and she is tried anti-inflammatories she is tried different shoe gear without relief of her symptoms and is gradually becoming more of an issue for her.  She is due to go to [place] in the middle of March and would like to have something done if possible after that date   Review of Systems  All other systems reviewed and are negative.       Objective:  Physical Exam  Constitutional: She appears well-developed and well-nourished.  Cardiovascular: Intact distal pulses.  Pulmonary/Chest: Effort normal.  Musculoskeletal: Normal range of motion.  Neurological: She is alert.  Skin: Skin is warm.  Nursing note and vitals reviewed.   Neurovascular status intact muscle strength is adequate range of motion within normal limits with exquisite discomfort in the right first MPJ with significant diminishment of motion with no crepitus of the joint but only 5-10 degrees of dorsiflexion.  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  She has opted for surgical intervention I do think a biplanar type osteotomy of the right first metatarsal with spur removal will be best for her in this particular instance.  I reviewed the procedure with her  X-rays indicate there is spurring of the dorsal first metatarsal with narrowing of the joint surface and no other significant pathology

## 2017-04-30 NOTE — Progress Notes (Signed)
   Subjective:    Patient ID: Barbara Nguyen, female    DOB: 1970/08/20, 47 y.o.   MRN: [id number]  HPI    Review of Systems  All other systems reviewed and are negative.      Objective:   Physical Exam        Assessment & Plan:

## 2017-06-04 ENCOUNTER — Encounter: Payer: Self-pay | Admitting: Podiatry

## 2017-06-04 ENCOUNTER — Ambulatory Visit: Payer: BLUE CROSS/BLUE SHIELD | Admitting: Podiatry

## 2017-06-04 DIAGNOSIS — M205X1 Other deformities of toe(s) (acquired), right foot: Secondary | ICD-10-CM

## 2017-06-04 NOTE — Progress Notes (Signed)
Subjective:   Patient ID: Barbara Nguyen, female   DOB: 47 y.o.   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  Patient wants surgery understanding risk and today I went ahead and I explained the procedure that would be required and the fact that ultimately it could require fusion or joint implantation procedure.  Patient is willing to take the risk of surgery and at this time signed consent form and does understand complete recovery can take 6 months to 1 year and at this time I did dispense air fracture walker with all instructions on usage

## 2017-06-04 NOTE — Patient Instructions (Signed)
Pre-Operative Instructions  Congratulations, you have decided to take an important step towards improving your quality of life.  You can be assured that the doctors and staff at [facility] will be with you every step of the way.  Here are some important things you should know:  1. Plan to be at the surgery center/hospital at least 1 (one) hour prior to your scheduled time, unless otherwise directed by the surgical center/hospital staff.  You must have a responsible adult accompany you, remain during the surgery and drive you home.  Make sure you have directions to the surgical center/hospital to ensure you arrive on time. 2. If you are having surgery at [facility] or [facility] hospitals, you will need a copy of your medical history and physical form from your family physician within one month prior to the date of surgery. We will give you a form for your primary physician to complete.  3. We make every effort to accommodate the date you request for surgery.  However, there are times where surgery dates or times have to be moved.  We will contact you as soon as possible if a change in schedule is required.   4. No aspirin/ibuprofen for one week before surgery.  If you are on aspirin, any non-steroidal anti-inflammatory medications (Mobic, Aleve, Ibuprofen) should not be taken seven (7) days prior to your surgery.  You make take Tylenol for pain prior to surgery.  5. Medications - If you are taking daily heart and blood pressure medications, seizure, reflux, allergy, asthma, anxiety, pain or diabetes medications, make sure you notify the surgery center/hospital before the day of surgery so they can tell you which medications you should take or avoid the day of surgery. 6. No food or drink after midnight the night before surgery unless directed otherwise by surgical center/hospital staff. 7. No alcoholic beverages 24-hours prior to surgery.  No smoking 24-hours prior or 24-hours after  surgery. 8. Wear loose pants or shorts. They should be loose enough to fit over bandages, boots, and casts. 9. Don't wear slip-on shoes. Sneakers are preferred. 10. Bring your boot with you to the surgery center/hospital.  Also bring crutches or a walker if your physician has prescribed it for you.  If you do not have this equipment, it will be provided for you after surgery. 11. If you have not been contacted by the surgery center/hospital by the day before your surgery, call to confirm the date and time of your surgery. 12. Leave-time from work may vary depending on the type of surgery you have.  Appropriate arrangements should be made prior to surgery with your employer. 13. Prescriptions will be provided immediately following surgery by your doctor.  Fill these as soon as possible after surgery and take the medication as directed. Pain medications will not be refilled on weekends and must be approved by the doctor. 14. Remove nail polish on the operative foot and avoid getting pedicures prior to surgery. 15. Wash the night before surgery.  The night before surgery wash the foot and leg well with water and the antibacterial soap provided. Be sure to pay special attention to beneath the toenails and in between the toes.  Wash for at least three (3) minutes. Rinse thoroughly with water and dry well with a towel.  Perform this wash unless told not to do so by your physician.  Enclosed: 1 Ice pack (please put in freezer the night before surgery)   1 Hibiclens skin cleaner     Pre-op instructions  If you have any questions regarding the instructions, please do not hesitate to call our office.  [place]: [address], [place], [place] [address] -- [phone]  [place]: [address] [place], [place] [address] -- [phone]  [place]: [address]  [place], [place] [address] -- [phone]  [place]: [address], [address], [place], [place] [address] -- [phone]  Website:  [email or web address] 

## 2017-06-27 ENCOUNTER — Other Ambulatory Visit: Payer: BLUE CROSS/BLUE SHIELD

## 2017-08-07 ENCOUNTER — Encounter: Payer: Self-pay | Admitting: Podiatry

## 2017-08-07 DIAGNOSIS — M21611 Bunion of right foot: Secondary | ICD-10-CM | POA: Diagnosis not present

## 2017-08-07 DIAGNOSIS — D492 Neoplasm of unspecified behavior of bone, soft tissue, and skin: Secondary | ICD-10-CM | POA: Diagnosis not present

## 2017-08-07 DIAGNOSIS — Z01818 Encounter for other preprocedural examination: Secondary | ICD-10-CM | POA: Diagnosis not present

## 2017-08-07 DIAGNOSIS — M2011 Hallux valgus (acquired), right foot: Secondary | ICD-10-CM | POA: Diagnosis not present

## 2017-08-07 DIAGNOSIS — D2121 Benign neoplasm of connective and other soft tissue of right lower limb, including hip: Secondary | ICD-10-CM | POA: Diagnosis not present

## 2017-08-07 DIAGNOSIS — M2021 Hallux rigidus, right foot: Secondary | ICD-10-CM | POA: Diagnosis not present

## 2017-08-07 DIAGNOSIS — D2120 Benign neoplasm of connective and other soft tissue of unspecified lower limb, including hip: Secondary | ICD-10-CM | POA: Diagnosis not present

## 2017-08-10 ENCOUNTER — Encounter: Payer: Self-pay | Admitting: Podiatry

## 2017-08-15 ENCOUNTER — Ambulatory Visit (INDEPENDENT_AMBULATORY_CARE_PROVIDER_SITE_OTHER): Payer: BLUE CROSS/BLUE SHIELD

## 2017-08-15 ENCOUNTER — Ambulatory Visit (INDEPENDENT_AMBULATORY_CARE_PROVIDER_SITE_OTHER): Payer: BLUE CROSS/BLUE SHIELD | Admitting: Podiatry

## 2017-08-15 VITALS — BP 126/81 | HR 66 | Temp 98.7°F

## 2017-08-15 DIAGNOSIS — M205X1 Other deformities of toe(s) (acquired), right foot: Secondary | ICD-10-CM

## 2017-08-15 DIAGNOSIS — M722 Plantar fascial fibromatosis: Secondary | ICD-10-CM

## 2017-08-15 NOTE — Progress Notes (Signed)
Subjective:   Patient ID: Barbara Nguyen, female   DOB: 47 y.o.   MRN: [id number]   HPI Patient states doing real well and so far having minimal discomfort and I am very pleased   ROS      Objective:  Physical Exam  Neurovascular status intact with patient's right foot doing well with wound edges well coapted good range of motion negative Homans sign noted and no crepitus of the joint noted  Assessment:  Doing well post osteotomy first metatarsal right foot     Plan:  Reviewed condition and recommended continued range of motion exercises and I am sending to physical therapy for aggressive movement of the joint due to the fact there is a hallux limitus condition present.  Reviewed x-ray with patient and continue elevation compression immobilization  X-ray indicates the osteotomy is healing well with good movement and joint congruence

## 2017-08-22 DIAGNOSIS — M25675 Stiffness of left foot, not elsewhere classified: Secondary | ICD-10-CM | POA: Diagnosis not present

## 2017-08-22 DIAGNOSIS — M25572 Pain in left ankle and joints of left foot: Secondary | ICD-10-CM | POA: Diagnosis not present

## 2017-08-22 DIAGNOSIS — M25475 Effusion, left foot: Secondary | ICD-10-CM | POA: Diagnosis not present

## 2017-08-22 DIAGNOSIS — R269 Unspecified abnormalities of gait and mobility: Secondary | ICD-10-CM | POA: Diagnosis not present

## 2017-08-24 DIAGNOSIS — R269 Unspecified abnormalities of gait and mobility: Secondary | ICD-10-CM | POA: Diagnosis not present

## 2017-08-24 DIAGNOSIS — M25475 Effusion, left foot: Secondary | ICD-10-CM | POA: Diagnosis not present

## 2017-08-24 DIAGNOSIS — M25572 Pain in left ankle and joints of left foot: Secondary | ICD-10-CM | POA: Diagnosis not present

## 2017-08-24 DIAGNOSIS — M25675 Stiffness of left foot, not elsewhere classified: Secondary | ICD-10-CM | POA: Diagnosis not present

## 2017-08-28 DIAGNOSIS — M25572 Pain in left ankle and joints of left foot: Secondary | ICD-10-CM | POA: Diagnosis not present

## 2017-08-28 DIAGNOSIS — R269 Unspecified abnormalities of gait and mobility: Secondary | ICD-10-CM | POA: Diagnosis not present

## 2017-08-28 DIAGNOSIS — M25675 Stiffness of left foot, not elsewhere classified: Secondary | ICD-10-CM | POA: Diagnosis not present

## 2017-08-28 DIAGNOSIS — M25475 Effusion, left foot: Secondary | ICD-10-CM | POA: Diagnosis not present

## 2017-08-29 ENCOUNTER — Ambulatory Visit (INDEPENDENT_AMBULATORY_CARE_PROVIDER_SITE_OTHER): Payer: BLUE CROSS/BLUE SHIELD | Admitting: Podiatry

## 2017-08-29 ENCOUNTER — Ambulatory Visit (INDEPENDENT_AMBULATORY_CARE_PROVIDER_SITE_OTHER): Payer: BLUE CROSS/BLUE SHIELD

## 2017-08-29 ENCOUNTER — Encounter: Payer: Self-pay | Admitting: Podiatry

## 2017-08-29 DIAGNOSIS — M205X1 Other deformities of toe(s) (acquired), right foot: Secondary | ICD-10-CM | POA: Diagnosis not present

## 2017-08-29 DIAGNOSIS — M722 Plantar fascial fibromatosis: Secondary | ICD-10-CM

## 2017-08-29 NOTE — Progress Notes (Signed)
Subjective:   Patient ID: Barbara Nguyen, female   DOB: 47 y.o.   MRN: [id number]   HPI Patient presents stating overall doing well but she does still get some discomfort when the joints were especially with physical therapy   ROS      Objective:  Physical Exam  Neurovascular status intact muscle strength is adequate patient right foot doing well overall with good range of motion no crepitus of the joint mild edema consistent with this.  And mild pain     Assessment:  Overall doing well with patient having mild discomfort which I would call consistent with the.  She is had postoperative     Plan:  At this point I am satisfied with how she is doing I reviewed x-rays and I encouraged on continued range of motion exercises taking ibuprofen as needed and try not to overdo it.  Patient will continue with immobilization compression elevation and will be seen back 4 weeks or earlier if needed  X-rays indicate that there is excellent healing of the osteotomy fixation in place with joint open

## 2017-08-31 DIAGNOSIS — R269 Unspecified abnormalities of gait and mobility: Secondary | ICD-10-CM | POA: Diagnosis not present

## 2017-08-31 DIAGNOSIS — M25675 Stiffness of left foot, not elsewhere classified: Secondary | ICD-10-CM | POA: Diagnosis not present

## 2017-08-31 DIAGNOSIS — M25475 Effusion, left foot: Secondary | ICD-10-CM | POA: Diagnosis not present

## 2017-08-31 DIAGNOSIS — M25572 Pain in left ankle and joints of left foot: Secondary | ICD-10-CM | POA: Diagnosis not present

## 2017-09-07 DIAGNOSIS — M25475 Effusion, left foot: Secondary | ICD-10-CM | POA: Diagnosis not present

## 2017-09-07 DIAGNOSIS — R269 Unspecified abnormalities of gait and mobility: Secondary | ICD-10-CM | POA: Diagnosis not present

## 2017-09-07 DIAGNOSIS — M25572 Pain in left ankle and joints of left foot: Secondary | ICD-10-CM | POA: Diagnosis not present

## 2017-09-07 DIAGNOSIS — M25675 Stiffness of left foot, not elsewhere classified: Secondary | ICD-10-CM | POA: Diagnosis not present

## 2017-09-10 DIAGNOSIS — R269 Unspecified abnormalities of gait and mobility: Secondary | ICD-10-CM | POA: Diagnosis not present

## 2017-09-10 DIAGNOSIS — M25675 Stiffness of left foot, not elsewhere classified: Secondary | ICD-10-CM | POA: Diagnosis not present

## 2017-09-10 DIAGNOSIS — M25572 Pain in left ankle and joints of left foot: Secondary | ICD-10-CM | POA: Diagnosis not present

## 2017-09-10 DIAGNOSIS — M25475 Effusion, left foot: Secondary | ICD-10-CM | POA: Diagnosis not present

## 2017-09-13 DIAGNOSIS — M25475 Effusion, left foot: Secondary | ICD-10-CM | POA: Diagnosis not present

## 2017-09-13 DIAGNOSIS — M25572 Pain in left ankle and joints of left foot: Secondary | ICD-10-CM | POA: Diagnosis not present

## 2017-09-13 DIAGNOSIS — M25675 Stiffness of left foot, not elsewhere classified: Secondary | ICD-10-CM | POA: Diagnosis not present

## 2017-09-13 DIAGNOSIS — R269 Unspecified abnormalities of gait and mobility: Secondary | ICD-10-CM | POA: Diagnosis not present

## 2017-09-17 DIAGNOSIS — R269 Unspecified abnormalities of gait and mobility: Secondary | ICD-10-CM | POA: Diagnosis not present

## 2017-09-17 DIAGNOSIS — M25572 Pain in left ankle and joints of left foot: Secondary | ICD-10-CM | POA: Diagnosis not present

## 2017-09-17 DIAGNOSIS — M25475 Effusion, left foot: Secondary | ICD-10-CM | POA: Diagnosis not present

## 2017-09-17 DIAGNOSIS — M25675 Stiffness of left foot, not elsewhere classified: Secondary | ICD-10-CM | POA: Diagnosis not present

## 2017-09-25 DIAGNOSIS — M25572 Pain in left ankle and joints of left foot: Secondary | ICD-10-CM | POA: Diagnosis not present

## 2017-09-25 DIAGNOSIS — M25475 Effusion, left foot: Secondary | ICD-10-CM | POA: Diagnosis not present

## 2017-09-25 DIAGNOSIS — R269 Unspecified abnormalities of gait and mobility: Secondary | ICD-10-CM | POA: Diagnosis not present

## 2017-09-25 DIAGNOSIS — M25675 Stiffness of left foot, not elsewhere classified: Secondary | ICD-10-CM | POA: Diagnosis not present

## 2017-09-26 ENCOUNTER — Ambulatory Visit (INDEPENDENT_AMBULATORY_CARE_PROVIDER_SITE_OTHER): Payer: Self-pay | Admitting: Podiatry

## 2017-09-26 ENCOUNTER — Ambulatory Visit (INDEPENDENT_AMBULATORY_CARE_PROVIDER_SITE_OTHER): Payer: BLUE CROSS/BLUE SHIELD

## 2017-09-26 DIAGNOSIS — M205X1 Other deformities of toe(s) (acquired), right foot: Secondary | ICD-10-CM | POA: Diagnosis not present

## 2017-09-26 MED ORDER — DICLOFENAC SODIUM 75 MG PO TBEC: 75.0000 mg | DELAYED_RELEASE_TABLET | Freq: Two times a day (BID) | ORAL | 2 refills | Status: AC

## 2017-09-26 NOTE — Progress Notes (Signed)
Subjective:   Patient ID: Barbara Nguyen, female   DOB: 47 y.o.   MRN: [id number]   HPI Patient states overall doing well but still has some pain in the joint when she tries to be active   ROS      Objective:  Physical Exam  Neurovascular status intact negative Homans sign noted with patient's right foot overall doing well with wound edges well coapted good range of motion and mild discomfort upon forced dorsi and plantar flexion     Assessment:  Overall doing well with mild to moderate discomfort still noted     Plan:  Reviewed condition and recommended anti-inflammatories and placed on diclofenac 75 mg twice daily and also advised on continued ice and continued range of motion exercises.  Reappoint 6 weeks to reevaluate  X-rays indicate the osteotomy is healing well joint congruous fixation in place with no indication of movement

## 2017-09-27 DIAGNOSIS — M25572 Pain in left ankle and joints of left foot: Secondary | ICD-10-CM | POA: Diagnosis not present

## 2017-09-27 DIAGNOSIS — M25675 Stiffness of left foot, not elsewhere classified: Secondary | ICD-10-CM | POA: Diagnosis not present

## 2017-09-27 DIAGNOSIS — R269 Unspecified abnormalities of gait and mobility: Secondary | ICD-10-CM | POA: Diagnosis not present

## 2017-09-27 DIAGNOSIS — M25475 Effusion, left foot: Secondary | ICD-10-CM | POA: Diagnosis not present

## 2017-10-02 DIAGNOSIS — M25475 Effusion, left foot: Secondary | ICD-10-CM | POA: Diagnosis not present

## 2017-10-02 DIAGNOSIS — M25675 Stiffness of left foot, not elsewhere classified: Secondary | ICD-10-CM | POA: Diagnosis not present

## 2017-10-02 DIAGNOSIS — R269 Unspecified abnormalities of gait and mobility: Secondary | ICD-10-CM | POA: Diagnosis not present

## 2017-10-02 DIAGNOSIS — M25572 Pain in left ankle and joints of left foot: Secondary | ICD-10-CM | POA: Diagnosis not present

## 2017-10-15 DIAGNOSIS — M25475 Effusion, left foot: Secondary | ICD-10-CM | POA: Diagnosis not present

## 2017-10-15 DIAGNOSIS — M25572 Pain in left ankle and joints of left foot: Secondary | ICD-10-CM | POA: Diagnosis not present

## 2017-10-15 DIAGNOSIS — R269 Unspecified abnormalities of gait and mobility: Secondary | ICD-10-CM | POA: Diagnosis not present

## 2017-10-15 DIAGNOSIS — M25675 Stiffness of left foot, not elsewhere classified: Secondary | ICD-10-CM | POA: Diagnosis not present

## 2017-11-07 ENCOUNTER — Ambulatory Visit (INDEPENDENT_AMBULATORY_CARE_PROVIDER_SITE_OTHER): Payer: BLUE CROSS/BLUE SHIELD | Admitting: Podiatry

## 2017-11-07 ENCOUNTER — Encounter: Payer: Self-pay | Admitting: Podiatry

## 2017-11-07 ENCOUNTER — Ambulatory Visit (INDEPENDENT_AMBULATORY_CARE_PROVIDER_SITE_OTHER): Payer: BLUE CROSS/BLUE SHIELD

## 2017-11-07 DIAGNOSIS — M2011 Hallux valgus (acquired), right foot: Secondary | ICD-10-CM | POA: Diagnosis not present

## 2017-11-07 DIAGNOSIS — M722 Plantar fascial fibromatosis: Secondary | ICD-10-CM

## 2017-11-07 DIAGNOSIS — M205X1 Other deformities of toe(s) (acquired), right foot: Secondary | ICD-10-CM

## 2017-11-07 NOTE — Progress Notes (Signed)
Subjective:   Patient ID: Barbara Nguyen, female   DOB: 47 y.o.   MRN: [id number]   HPI Patient states overall doing real well with minimal discomfort   ROS      Objective:  Physical Exam  Neurovascular status intact patient's right foot healing very well excellent range of motion with no crepitus     Assessment:  Doing well post hallux limitus surgery right     Plan:  Reviewed final x-ray and allowing patient to return to normal shoe gear and activity and will be seen back if any problems should occur  X-ray indicates osteotomies healing well joint is nice and round with good positional component noted

## 2018-03-04 DIAGNOSIS — Z01419 Encounter for gynecological examination (general) (routine) without abnormal findings: Secondary | ICD-10-CM | POA: Diagnosis not present

## 2018-03-04 DIAGNOSIS — Z124 Encounter for screening for malignant neoplasm of cervix: Secondary | ICD-10-CM | POA: Diagnosis not present

## 2018-03-04 DIAGNOSIS — Z30431 Encounter for routine checking of intrauterine contraceptive device: Secondary | ICD-10-CM | POA: Diagnosis not present

## 2018-03-04 DIAGNOSIS — Z3040 Encounter for surveillance of contraceptives, unspecified: Secondary | ICD-10-CM | POA: Diagnosis not present

## 2018-03-04 DIAGNOSIS — Z1231 Encounter for screening mammogram for malignant neoplasm of breast: Secondary | ICD-10-CM | POA: Diagnosis not present

## 2018-03-04 DIAGNOSIS — R87618 Other abnormal cytological findings on specimens from cervix uteri: Secondary | ICD-10-CM | POA: Diagnosis not present

## 2018-04-19 DIAGNOSIS — F329 Major depressive disorder, single episode, unspecified: Secondary | ICD-10-CM | POA: Diagnosis not present

## 2018-04-19 DIAGNOSIS — M25559 Pain in unspecified hip: Secondary | ICD-10-CM | POA: Diagnosis not present

## 2018-04-29 DIAGNOSIS — R6889 Other general symptoms and signs: Secondary | ICD-10-CM | POA: Diagnosis not present

## 2018-04-29 DIAGNOSIS — F329 Major depressive disorder, single episode, unspecified: Secondary | ICD-10-CM | POA: Diagnosis not present

## 2018-09-26 DIAGNOSIS — D485 Neoplasm of uncertain behavior of skin: Secondary | ICD-10-CM | POA: Diagnosis not present

## 2018-09-26 DIAGNOSIS — D2262 Melanocytic nevi of left upper limb, including shoulder: Secondary | ICD-10-CM | POA: Diagnosis not present

## 2018-09-26 DIAGNOSIS — D2261 Melanocytic nevi of right upper limb, including shoulder: Secondary | ICD-10-CM | POA: Diagnosis not present

## 2018-09-26 DIAGNOSIS — D225 Melanocytic nevi of trunk: Secondary | ICD-10-CM | POA: Diagnosis not present

## 2018-09-26 DIAGNOSIS — L821 Other seborrheic keratosis: Secondary | ICD-10-CM | POA: Diagnosis not present

## 2018-11-14 ENCOUNTER — Other Ambulatory Visit: Payer: Self-pay

## 2018-11-14 DIAGNOSIS — Z20822 Contact with and (suspected) exposure to covid-19: Secondary | ICD-10-CM

## 2018-11-16 LAB — NOVEL CORONAVIRUS, NAA: SARS-CoV-2, NAA: NOT DETECTED

## 2019-01-07 ENCOUNTER — Other Ambulatory Visit: Payer: Self-pay

## 2019-01-07 DIAGNOSIS — Z20822 Contact with and (suspected) exposure to covid-19: Secondary | ICD-10-CM

## 2019-01-09 LAB — NOVEL CORONAVIRUS, NAA: SARS-CoV-2, NAA: NOT DETECTED

## 2019-01-16 DIAGNOSIS — H18832 Recurrent erosion of cornea, left eye: Secondary | ICD-10-CM | POA: Diagnosis not present

## 2019-02-21 DIAGNOSIS — Z03818 Encounter for observation for suspected exposure to other biological agents ruled out: Secondary | ICD-10-CM | POA: Diagnosis not present

## 2019-05-12 DIAGNOSIS — Z20828 Contact with and (suspected) exposure to other viral communicable diseases: Secondary | ICD-10-CM | POA: Diagnosis not present

## 2019-05-12 DIAGNOSIS — Z7189 Other specified counseling: Secondary | ICD-10-CM | POA: Diagnosis not present

## 2019-08-08 DIAGNOSIS — D2271 Melanocytic nevi of right lower limb, including hip: Secondary | ICD-10-CM | POA: Diagnosis not present

## 2019-08-08 DIAGNOSIS — L821 Other seborrheic keratosis: Secondary | ICD-10-CM | POA: Diagnosis not present

## 2019-08-08 DIAGNOSIS — D2272 Melanocytic nevi of left lower limb, including hip: Secondary | ICD-10-CM | POA: Diagnosis not present

## 2019-08-08 DIAGNOSIS — L814 Other melanin hyperpigmentation: Secondary | ICD-10-CM | POA: Diagnosis not present

## 2019-10-09 DIAGNOSIS — F329 Major depressive disorder, single episode, unspecified: Secondary | ICD-10-CM | POA: Diagnosis not present

## 2023-11-01 ENCOUNTER — Other Ambulatory Visit (HOSPITAL_BASED_OUTPATIENT_CLINIC_OR_DEPARTMENT_OTHER): Payer: Self-pay

## 2023-11-01 DIAGNOSIS — E7800 Pure hypercholesterolemia, unspecified: Secondary | ICD-10-CM
